# Patient Record
Sex: Female | Born: 1995 | Race: White | Hispanic: No | Marital: Married | Smoking: Never smoker
Health system: Southern US, Community
[De-identification: ages and names within clinical notes are randomized; demographics above are authoritative.]

---

## 2021-11-10 ENCOUNTER — Emergency Department (HOSPITAL_BASED_OUTPATIENT_CLINIC_OR_DEPARTMENT_OTHER)

## 2021-11-10 ENCOUNTER — Encounter (HOSPITAL_BASED_OUTPATIENT_CLINIC_OR_DEPARTMENT_OTHER): Payer: Self-pay | Admitting: *Deleted

## 2021-11-10 ENCOUNTER — Emergency Department (HOSPITAL_BASED_OUTPATIENT_CLINIC_OR_DEPARTMENT_OTHER)
Admission: EM | Admit: 2021-11-10 | Discharge: 2021-11-10 | Disposition: A | Discharge status: Home / Self Care | Attending: Emergency Medicine | Admitting: Emergency Medicine

## 2021-11-10 ENCOUNTER — Other Ambulatory Visit: Payer: Self-pay

## 2021-11-10 DIAGNOSIS — Y906 Blood alcohol level of 120-199 mg/100 ml: Secondary | ICD-10-CM | POA: Diagnosis not present

## 2021-11-10 DIAGNOSIS — F10220 Alcohol dependence with intoxication, uncomplicated: Secondary | ICD-10-CM | POA: Diagnosis not present

## 2021-11-10 DIAGNOSIS — F10920 Alcohol use, unspecified with intoxication, uncomplicated: Secondary | ICD-10-CM

## 2021-11-10 DIAGNOSIS — R0989 Other specified symptoms and signs involving the circulatory and respiratory systems: Secondary | ICD-10-CM | POA: Insufficient documentation

## 2021-11-10 DIAGNOSIS — T17308A Unspecified foreign body in larynx causing other injury, initial encounter: Secondary | ICD-10-CM

## 2021-11-10 LAB — CBC WITH DIFFERENTIAL/PLATELET
Abs Immature Granulocytes: 0.01 10*3/uL (ref 0.00–0.07)
Basophils Absolute: 0 10*3/uL (ref 0.0–0.1)
Basophils Relative: 1 %
Eosinophils Absolute: 0.1 10*3/uL (ref 0.0–0.5)
Eosinophils Relative: 2 %
HCT: 41.9 % (ref 36.0–46.0)
Hemoglobin: 14.6 g/dL (ref 12.0–15.0)
Immature Granulocytes: 0 %
Lymphocytes Relative: 35 %
Lymphs Abs: 2.1 10*3/uL (ref 0.7–4.0)
MCH: 30.5 pg (ref 26.0–34.0)
MCHC: 34.8 g/dL (ref 30.0–36.0)
MCV: 87.7 fL (ref 80.0–100.0)
Monocytes Absolute: 0.2 10*3/uL (ref 0.1–1.0)
Monocytes Relative: 3 %
Neutro Abs: 3.5 10*3/uL (ref 1.7–7.7)
Neutrophils Relative %: 59 %
Platelets: 381 10*3/uL (ref 150–400)
RBC: 4.78 MIL/uL (ref 3.87–5.11)
RDW: 12.8 % (ref 11.5–15.5)
WBC: 5.9 10*3/uL (ref 4.0–10.5)
nRBC: 0 % (ref 0.0–0.2)

## 2021-11-10 LAB — RAPID URINE DRUG SCREEN, HOSP PERFORMED
Amphetamines: NOT DETECTED
Barbiturates: NOT DETECTED
Benzodiazepines: NOT DETECTED
Cocaine: NOT DETECTED
Opiates: NOT DETECTED
Tetrahydrocannabinol: NOT DETECTED

## 2021-11-10 LAB — COMPREHENSIVE METABOLIC PANEL
ALT: 19 U/L (ref 0–44)
AST: 24 U/L (ref 15–41)
Albumin: 4.9 g/dL (ref 3.5–5.0)
Alkaline Phosphatase: 60 U/L (ref 38–126)
Anion gap: 12 (ref 5–15)
BUN: 14 mg/dL (ref 6–20)
CO2: 21 mmol/L — ABNORMAL LOW (ref 22–32)
Calcium: 9 mg/dL (ref 8.9–10.3)
Chloride: 108 mmol/L (ref 98–111)
Creatinine, Ser: 0.9 mg/dL (ref 0.44–1.00)
GFR, Estimated: >60 mL/min (ref >60)
Glucose, Bld: 83 mg/dL (ref 70–99)
Potassium: 3.7 mmol/L (ref 3.5–5.1)
Sodium: 141 mmol/L (ref 135–145)
Total Bilirubin: 0.3 mg/dL (ref 0.3–1.2)
Total Protein: 7.9 g/dL (ref 6.5–8.1)

## 2021-11-10 LAB — HCG, SERUM, QUALITATIVE: Preg, Serum: NEGATIVE

## 2021-11-10 LAB — ETHANOL: Alcohol, Ethyl (B): 134 mg/dL — ABNORMAL HIGH (ref <10)

## 2021-11-10 MED ORDER — ACETAMINOPHEN 500 MG PO TABS
1000.0000 mg | ORAL_TABLET | Freq: Once | ORAL | Status: AC
Start: 2021-11-10 — End: 2021-11-10
  Administered 2021-11-10: 1000 mg via ORAL
  Filled 2021-11-10: qty 2

## 2021-11-10 MED ORDER — ONDANSETRON HCL 4 MG/2ML IJ SOLN
4.0000 mg | Freq: Once | INTRAMUSCULAR | Status: AC
  Administered 2021-11-10: 4 mg via INTRAVENOUS
  Filled 2021-11-10: qty 2

## 2021-11-10 MED ORDER — SODIUM CHLORIDE 0.9 % IV BOLUS
1000.0000 mL | Freq: Once | INTRAVENOUS | Status: AC
  Administered 2021-11-10: 1000 mL via INTRAVENOUS
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
## 2021-11-10 NOTE — ED Notes (Signed)
Pt took a few sips of water with no n/v noted. Pt stood up and walked around the restroom a bit. Pt steady on her feet but states she feels dizzy
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
